# Patient Record
Sex: Male | Born: 2000 | Race: Black or African American | Hispanic: No | Marital: Single | Smoking: Never smoker
Health system: Southern US, Community
[De-identification: ages and names within clinical notes are randomized; demographics above are authoritative.]

---
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
## 2018-03-20 ENCOUNTER — Other Ambulatory Visit: Payer: Self-pay | Admitting: Family Medicine

## 2018-03-20 DIAGNOSIS — Z130 Encounter for screening for diseases of the blood and blood-forming organs and certain disorders involving the immune mechanism: Secondary | ICD-10-CM

## 2018-08-23 ENCOUNTER — Other Ambulatory Visit: Payer: Self-pay | Admitting: Family Medicine

## 2018-08-23 DIAGNOSIS — R1031 Right lower quadrant pain: Secondary | ICD-10-CM

## 2019-11-06 ENCOUNTER — Other Ambulatory Visit: Payer: Self-pay

## 2019-11-06 ENCOUNTER — Ambulatory Visit
Admission: RE | Admit: 2019-11-06 | Discharge: 2019-11-06 | Disposition: A | Discharge status: Home / Self Care | Payer: PPO | Source: Ambulatory Visit | Attending: Sports Medicine | Admitting: Sports Medicine

## 2019-11-06 ENCOUNTER — Ambulatory Visit (INDEPENDENT_AMBULATORY_CARE_PROVIDER_SITE_OTHER): Payer: PPO | Admitting: Sports Medicine

## 2019-11-06 VITALS — BP 100/70 | Ht 72.0 in | Wt 175.0 lb

## 2019-11-06 DIAGNOSIS — S4990XA Unspecified injury of shoulder and upper arm, unspecified arm, initial encounter: Secondary | ICD-10-CM

## 2019-11-06 NOTE — Progress Notes (Signed)
   Subjective:    Patient ID: Eddie Brooks, male    DOB: 2001-01-07, 19 y.o.   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  He fell landing directly on the right shoulder.  Had immediate pain as well as some numbness and tingling into the hand.  Numbness and tingling have improved but pain persists.  He localizes his pain over the acromioclavicular joint.  He does recall a previous injury to the same shoulder, also as a result of falling directly on his [facility] joint, but his current pain is different in nature than what he experienced at that time.  He denies any previous shoulder surgeries.  He is most comfortable with his arm at his side.  He presents today in a sling.  He was evaluated last night in the training room by our sports medicine fellow, Dr. Christena Deem, who recommended that he see me today for possible x-rays.  Past medical history reviewed.  He is otherwise healthy Medications reviewed Allergies reviewed    Review of Systems    As above Objective:   Physical Exam  Well-developed, fit appearing.  No acute distress.  Awake alert and oriented x3.  Vital signs reviewed  Right shoulder: There is a prominence of the distal clavicle above the acromium consistent with possible AC separation.  There is tenderness to palpation in this area.  Positive crossover test.  Patient has fairly good range of motion although it is painful.  Good strength.  Neurovascular intact distally.  X-rays of right distal clavicle are obtained.  Left distal clavicle x-rays are also obtained for comparison purposes.  There is an unusual contour to the distal clavicle with flaring in the cranial direction.  There is also slight widening of the [facility] joint on the right.  No acute fracture is seen.      Assessment & Plan:   Right  shoulder pain likely secondary to grade 2 AC joint sprain  The unusual contour seen in the right distal clavicle may very well be posttraumatic change from his injury in high school.  Although he does have some widening of the [facility] joint, I do not see any obvious acute fracture.  Patient will remain in his sling but may remove it for simple pendulum exercises and I will have him follow-up with our sports medicine fellow, Dr. Christena Deem, in the training room next week.  In the meantime, Dr. Lyn Hollingshead will review this case with Dr. Thurston Hole just to ensure that he agrees that no further work-up is necessary at this point in time.

## 2019-11-06 NOTE — Progress Notes (Signed)
d 

## 2022-11-24 ENCOUNTER — Encounter: Payer: Self-pay | Admitting: Registered Nurse

## 2022-11-24 ENCOUNTER — Telehealth: Payer: Self-pay | Admitting: Registered Nurse

## 2022-11-24 DIAGNOSIS — S61219A Laceration without foreign body of unspecified finger without damage to nail, initial encounter: Secondary | ICD-10-CM

## 2022-11-24 NOTE — Telephone Encounter (Signed)
Patient reported cut finger on zipper suitcase leaving for [place] today international flight bleeding has stopped with elevating and pressure.  Patient seen in clinic paper towel wrapped finger clean and dry no blood on papers.  Straight 1cm laceration medial 2nd digit right hand.  Capillary refill less than 2 seconds.  Abrasion noted fingernail.  No edema, discharge, mild tenderness full AROM PIP/DIP/MCP joints.  Epic reviewed last tdap 2015.  Given 10 UD finger bandages, steristrips and triple antibiotic to apply daily and change prn until healed.  Applied 1 steristrip and covered with elastic bandage.  Keep covered during travel avoid putting in dirty water e.g. river/lake until healed fully scabbed over.  Refused oral antibiotics to have on him during travel.  Wash with soap and water daily.  Patient verbalized understanding information/instructions, agreed with plan of care and had no further questions at this time.

## 2023-04-04 ENCOUNTER — Encounter: Payer: Self-pay | Admitting: Registered Nurse

## 2023-04-04 ENCOUNTER — Telehealth: Payer: Self-pay | Admitting: Registered Nurse

## 2023-04-05 NOTE — Telephone Encounter (Signed)
Patient needed [facility] medical form completed for immunizations history.  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  Patient given contact information to schedule appt as these services not available at [facility]. Patient will need to bring [facility] form with him to have one of these providers complete that section.  Immunizations otherwise met [employer] requirements and annotated on form.    [employer] student health [email or web address]  please call [phone] to schedule by phone.     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

## 2023-04-07 ENCOUNTER — Encounter: Payer: Self-pay | Admitting: Registered Nurse

## 2023-04-07 ENCOUNTER — Telehealth: Payer: Self-pay | Admitting: Registered Nurse

## 2023-04-07 DIAGNOSIS — Z0000 Encounter for general adult medical examination without abnormal findings: Secondary | ICD-10-CM

## 2023-04-07 NOTE — Telephone Encounter (Signed)
RN Reece Packer printed [employer] record for patient 04/06/23.  Copy placed in paper record at [facility] clinic.  Original given to HR Tim as patient requested scan to his email.  Patient contacted student health and they are unable to do PPD/quantiferon test.  Patient will contact county health dept to schedule appt required by [facility] for students.  Discussed PPD and quantiferon testing not available at [facility] also.

## 2023-04-14 ENCOUNTER — Encounter: Payer: Self-pay | Admitting: Registered Nurse

## 2023-04-14 ENCOUNTER — Telehealth: Payer: Self-pay | Admitting: Registered Nurse

## 2023-04-14 DIAGNOSIS — Z020 Encounter for examination for admission to educational institution: Secondary | ICD-10-CM

## 2023-04-14 NOTE — Telephone Encounter (Signed)
Patient tried to schedule PPD with student health and they stated they do not perform these at their clinic.  Contacted county health dept and notified that they only perform gold interferon after a positive PPD within the previous 12 months.  He was encouraged to contact his PCM by [facility] office staff to get appt for PPD.  Patient asking if gold interferon can be performed at [facility].  Discussed we do not have required tubes onsite.  HR Tim contacted NP and stated approved for patient to go to [facility].  Orders placed in [facility] portal for patient and sent PDF to take with him to [facility] center.  Patient agreed with plan of care and had no further questions at this time.

## 2023-04-18 ENCOUNTER — Other Ambulatory Visit: Payer: Self-pay | Admitting: Registered Nurse

## 2023-04-21 LAB — QUANTIFERON-TB GOLD PLUS
QuantiFERON Mitogen Value: >10 [IU]/mL
QuantiFERON Nil Value: 0.04 [IU]/mL
QuantiFERON TB1 Ag Value: 0.04 [IU]/mL
QuantiFERON TB2 Ag Value: 0.03 [IU]/mL
QuantiFERON-TB Gold Plus: NEGATIVE

## 2023-04-23 NOTE — Telephone Encounter (Signed)
Gold interferon test negative will complete [employer] paperwork for patient when onsite 04/25/23

## 2023-04-25 DIAGNOSIS — R042 Hemoptysis: Secondary | ICD-10-CM | POA: Diagnosis not present

## 2023-04-25 DIAGNOSIS — Z20822 Contact with and (suspected) exposure to covid-19: Secondary | ICD-10-CM | POA: Diagnosis not present

## 2023-04-25 DIAGNOSIS — R059 Cough, unspecified: Secondary | ICD-10-CM | POA: Diagnosis not present

## 2023-04-25 DIAGNOSIS — G43909 Migraine, unspecified, not intractable, without status migrainosus: Secondary | ICD-10-CM | POA: Diagnosis not present

## 2023-04-25 DIAGNOSIS — R509 Fever, unspecified: Secondary | ICD-10-CM | POA: Diagnosis not present

## 2023-04-25 NOTE — Progress Notes (Signed)
Patient notified normal/negative results and completed [employer] immunization admissions paperwork for patient and sent to him PDF

## 2023-05-14 NOTE — Telephone Encounter (Signed)
Paperwork was completed and sent to patient personal email per his request.  Patient confirmed receipt and had no further questions or concerns at that time.
